# Patient Record
Sex: Female | Born: 2000 | Race: Black or African American | Hispanic: No | Marital: Single | Smoking: Current every day smoker
Health system: Southern US, Community
[De-identification: ages and names within clinical notes are randomized; demographics above are authoritative.]

---
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
## 2021-03-15 ENCOUNTER — Encounter (HOSPITAL_COMMUNITY): Payer: Self-pay

## 2021-03-15 ENCOUNTER — Emergency Department (HOSPITAL_COMMUNITY)
Admission: EM | Admit: 2021-03-15 | Discharge: 2021-03-15 | Disposition: A | Discharge status: Home / Self Care | Payer: Self-pay | Attending: Emergency Medicine | Admitting: Emergency Medicine

## 2021-03-15 ENCOUNTER — Other Ambulatory Visit: Payer: Self-pay

## 2021-03-15 DIAGNOSIS — F17210 Nicotine dependence, cigarettes, uncomplicated: Secondary | ICD-10-CM | POA: Insufficient documentation

## 2021-03-15 DIAGNOSIS — R42 Dizziness and giddiness: Secondary | ICD-10-CM | POA: Insufficient documentation

## 2021-03-15 DIAGNOSIS — R519 Headache, unspecified: Secondary | ICD-10-CM | POA: Insufficient documentation

## 2021-03-15 LAB — COMPREHENSIVE METABOLIC PANEL
ALT: 15 U/L (ref 0–44)
AST: 20 U/L (ref 15–41)
Albumin: 3.8 g/dL (ref 3.5–5.0)
Alkaline Phosphatase: 55 U/L (ref 38–126)
Anion gap: 7 (ref 5–15)
BUN: 6 mg/dL (ref 6–20)
CO2: 24 mmol/L (ref 22–32)
Calcium: 8.8 mg/dL — ABNORMAL LOW (ref 8.9–10.3)
Chloride: 103 mmol/L (ref 98–111)
Creatinine, Ser: 0.64 mg/dL (ref 0.44–1.00)
GFR, Estimated: >60 mL/min (ref >60)
Glucose, Bld: 101 mg/dL — ABNORMAL HIGH (ref 70–99)
Potassium: 3.4 mmol/L — ABNORMAL LOW (ref 3.5–5.1)
Sodium: 134 mmol/L — ABNORMAL LOW (ref 135–145)
Total Bilirubin: 0.3 mg/dL (ref 0.3–1.2)
Total Protein: 7.4 g/dL (ref 6.5–8.1)

## 2021-03-15 LAB — CBC WITH DIFFERENTIAL/PLATELET
Abs Immature Granulocytes: 0.02 10*3/uL (ref 0.00–0.07)
Basophils Absolute: 0 10*3/uL (ref 0.0–0.1)
Basophils Relative: 0 %
Eosinophils Absolute: 0 10*3/uL (ref 0.0–0.5)
Eosinophils Relative: 1 %
HCT: 38.4 % (ref 36.0–46.0)
Hemoglobin: 12.9 g/dL (ref 12.0–15.0)
Immature Granulocytes: 0 %
Lymphocytes Relative: 9 %
Lymphs Abs: 0.5 10*3/uL — ABNORMAL LOW (ref 0.7–4.0)
MCH: 31.3 pg (ref 26.0–34.0)
MCHC: 33.6 g/dL (ref 30.0–36.0)
MCV: 93.2 fL (ref 80.0–100.0)
Monocytes Absolute: 1.4 10*3/uL — ABNORMAL HIGH (ref 0.1–1.0)
Monocytes Relative: 28 %
Neutro Abs: 3.1 10*3/uL (ref 1.7–7.7)
Neutrophils Relative %: 62 %
Platelets: 203 10*3/uL (ref 150–400)
RBC: 4.12 MIL/uL (ref 3.87–5.11)
RDW: 14 % (ref 11.5–15.5)
WBC: 5 10*3/uL (ref 4.0–10.5)
nRBC: 0 % (ref 0.0–0.2)

## 2021-03-15 LAB — I-STAT BETA HCG BLOOD, ED (MC, WL, AP ONLY): I-stat hCG, quantitative: <5 m[IU]/mL (ref <5)

## 2021-03-15 MED ORDER — DIPHENHYDRAMINE HCL 50 MG/ML IJ SOLN
25.0000 mg | Freq: Once | INTRAMUSCULAR | Status: AC
  Administered 2021-03-15: 25 mg via INTRAVENOUS
  Filled 2021-03-15: qty 1

## 2021-03-15 MED ORDER — DEXAMETHASONE SODIUM PHOSPHATE 4 MG/ML IJ SOLN
4.0000 mg | Freq: Once | INTRAMUSCULAR | Status: AC
  Administered 2021-03-15: 4 mg via INTRAVENOUS
  Filled 2021-03-15: qty 1

## 2021-03-15 MED ORDER — MECLIZINE HCL 25 MG PO TABS
25.0000 mg | ORAL_TABLET | Freq: Once | ORAL | Status: AC
  Administered 2021-03-15: 25 mg via ORAL
  Filled 2021-03-15: qty 1

## 2021-03-15 MED ORDER — KETOROLAC TROMETHAMINE 30 MG/ML IJ SOLN
30.0000 mg | Freq: Once | INTRAMUSCULAR | Status: AC
  Administered 2021-03-15: 30 mg via INTRAVENOUS
  Filled 2021-03-15: qty 1

## 2021-03-15 MED ORDER — SODIUM CHLORIDE 0.9 % IV BOLUS
1000.0000 mL | Freq: Once | INTRAVENOUS | Status: AC
  Administered 2021-03-15: 1000 mL via INTRAVENOUS

## 2021-03-15 MED ORDER — METOCLOPRAMIDE HCL 10 MG PO TABS: 10.0000 mg | ORAL_TABLET | Freq: Four times a day (QID) | ORAL | 0 refills | Status: AC | PRN

## 2021-03-15 MED ORDER — ACETAMINOPHEN 500 MG PO TABS
1000.0000 mg | ORAL_TABLET | Freq: Once | ORAL | Status: AC
  Administered 2021-03-15: 1000 mg via ORAL
  Filled 2021-03-15: qty 2

## 2021-03-15 MED ORDER — METOCLOPRAMIDE HCL 5 MG/ML IJ SOLN
10.0000 mg | Freq: Once | INTRAMUSCULAR | Status: AC
  Administered 2021-03-15: 10 mg via INTRAVENOUS
  Filled 2021-03-15: qty 2

## 2021-03-15 NOTE — ED Notes (Signed)
Patient ambulatory to restroom  ?

## 2021-03-15 NOTE — ED Triage Notes (Signed)
Patient c/ointermittent headache x 3 days and intermittent dizziness that occurs when she turns to fast or stands up quickly. Patient denies any N/v or sensitivity to light.

## 2021-03-15 NOTE — ED Notes (Signed)
Pt denies pain and dizziness. Ambulated to restroom with steady gait.

## 2021-03-15 NOTE — ED Provider Notes (Signed)
[facility]-EMERGENCY DEPT Provider Note   CSN: [id number] Arrival date & time: 03/15/21  5427     History Chief Complaint  Patient presents with  . Headache  . Dizziness    Lydia Dunn is a 20 y.o. female here presenting with headache and dizziness.  Patient states that she has been having headaches for the last several years.  She states that she was told she has migraines.  For the last several days she has worsening headaches.  She states that pain is her whole head and is worse when she actually stands up.  She also feels lightheaded and dizzy.  She states that usually she takes ibuprofen and helps with her symptoms but it has not helped this time.  Denies any numbness or weakness or trouble speaking.  Denies any fevers or chills or neck pain.   The history is provided by the patient.       History reviewed. No pertinent past medical history.  There are no problems to display for this patient.   History reviewed. No pertinent surgical history.   OB History   No obstetric history on file.     Family History  Problem Relation Age of Onset  . Chronic Renal Failure Mother     Social History   Tobacco Use  . Smoking status: Current Every Day Smoker    Packs/day: 0.15    Types: Cigarettes  . Smokeless tobacco: Never Used  Vaping Use  . Vaping Use: Never used  Substance Use Topics  . Alcohol use: No  . Drug use: No    Home Medications Prior to Admission medications   Medication Sig Start Date End Date Taking? Authorizing Provider  ibuprofen (ADVIL,MOTRIN) 600 MG tablet Take 1 tablet (600 mg total) by mouth every 6 (six) hours as needed. 06/27/15   Derwood Kaplan, MD  naproxen sodium (ANAPROX) 220 MG tablet Take 220-440 mg by mouth every 12 (twelve) hours as needed (pain).     [provider]    Allergies    Patient has no known allergies.  Review of Systems   Review of Systems  Neurological: Positive for dizziness and  headaches.  All other systems reviewed and are negative.   Physical Exam Updated Vital Signs BP 132/87 (BP Location: Left Arm)   Pulse 84   Temp 99.3 F (37.4 C) (Oral)   Resp 12   Ht 5\' 3"  (1.6 m)   Wt 99.8 kg   LMP 03/02/2021 (Approximate)   SpO2 98%   BMI 38.97 kg/m   Physical Exam Vitals and nursing note reviewed.  Constitutional:      Comments: Uncomfortable   HENT:     Head: Normocephalic.  Eyes:     Extraocular Movements: Extraocular movements intact.     Pupils: Pupils are equal, round, and reactive to light.  Cardiovascular:     Rate and Rhythm: Normal rate and regular rhythm.     Heart sounds: Normal heart sounds.  Pulmonary:     Effort: Pulmonary effort is normal.     Breath sounds: Normal breath sounds.  Abdominal:     General: Bowel sounds are normal.     Palpations: Abdomen is soft.  Musculoskeletal:        General: Normal range of motion.     Cervical back: Normal range of motion and neck supple.  Skin:    General: Skin is warm.  Neurological:     Mental Status: She is alert and oriented to  person, place, and time.     Cranial Nerves: No cranial nerve deficit or facial asymmetry.     Sensory: No sensory deficit.     Motor: No weakness.     Comments: Nl finger to nose, no pronator drift, no facial droop   Psychiatric:        Mood and Affect: Mood normal.        Behavior: Behavior normal.     ED Results / Procedures / Treatments   Labs (all labs ordered are listed, but only abnormal results are displayed) Labs Reviewed  CBC WITH DIFFERENTIAL/PLATELET - Abnormal; Notable for the following components:      Result Value   Lymphs Abs 0.5 (*)    Monocytes Absolute 1.4 (*)    All other components within normal limits  COMPREHENSIVE METABOLIC PANEL - Abnormal; Notable for the following components:   Sodium 134 (*)    Potassium 3.4 (*)    Glucose, Bld 101 (*)    Calcium 8.8 (*)    All other components within normal limits  I-STAT BETA HCG  BLOOD, ED ([facility], [facility], [facility] ONLY)    EKG None  Radiology No results found.  Procedures Procedures   Medications Ordered in ED Medications  sodium chloride 0.9 % bolus 1,000 mL (has no administration in time range)  metoCLOPramide (REGLAN) injection 10 mg (has no administration in time range)  diphenhydrAMINE (BENADRYL) injection 25 mg (has no administration in time range)  ketorolac (TORADOL) 30 MG/ML injection 30 mg (has no administration in time range)  dexamethasone (DECADRON) injection 4 mg (has no administration in time range)  acetaminophen (TYLENOL) tablet 1,000 mg (1,000 mg Oral Given 03/15/21 1816)  meclizine (ANTIVERT) tablet 25 mg (25 mg Oral Given 03/15/21 1817)    ED Course  I have reviewed the triage vital signs and the nursing notes.  Pertinent labs & imaging results that were available during my care of the patient were reviewed by me and considered in my medical decision making (see chart for details).    MDM Rules/Calculators/A&P                         Lydia Dunn is a 20 y.o. female here with headaches and dizziness.  She has history of migraines and has worsening headaches.  She has nonfocal neuro exam.  We will hold off on CT head for now.  We will get basic lab work and hydrate and give migraine cocktail and reassess  8:26 PM Labs unremarkable.  Felt better after migraine cocktail.  Stable for discharge.  We will have her follow-up with neurology outpatient   Final Clinical Impression(s) / ED Diagnoses Final diagnoses:  None    Rx / DC Orders ED Discharge Orders    None       Charlynne Pander, MD 03/15/21 2028

## 2021-03-15 NOTE — ED Provider Notes (Signed)
Emergency Medicine Provider Triage Evaluation Note  Lydia Dunn , a 20 y.o. female  was evaluated in triage.  Pt complains of headache, nasal congestion, dizziness.  Patient states her symptoms began few days ago.  She reports she was having intermittent symptoms until last night, she has had persistent headache since which she describes as a tightness.  She does report continued intermittent dizziness, worse when she stands up or moves her head or eyes in different direction.  She describes her dizziness as feeling lightheaded or like she is in a fall over.  She does have a history of migraines, but has never had anything like this.  She took medicine for headache last night without significant improvement symptoms, has not tried anything else.  She denies fall, trauma, or injury.  No fevers or chills.  No vision changes.  No numbness or weakness.  She has a history of allergies, does not take medicine for it.  Review of Systems  Positive: HA, dizziness, nasal congestion Negative: fever  Physical Exam  BP (!) 138/92 (BP Location: Left Arm)   Pulse 86   Temp 98.8 F (37.1 C) (Oral)   Resp 17   SpO2 99%  Gen:   Awake, no distress   Resp:  Normal effort  MSK:   Moves extremities without difficulty  Other:  CN intact.  Nose to finger intact.  Equal grip strength.  Nasal congestion noted bilaterally.  TMs nonerythematous.  Medical Decision Making  Medically screening exam initiated at 5:07 PM.  Appropriate orders placed.  Lydia Dunn was informed that the remainder of the evaluation will be completed by another provider, this initial triage assessment does not replace that evaluation, and the importance of remaining in the ED until their evaluation is complete.  Labs and meds ordered.    Lydia Apley, PA-C 03/15/21 1721    Lydia Sprout, MD 03/15/21 2234

## 2021-03-15 NOTE — Discharge Instructions (Signed)
Take Tylenol or Motrin for headache.  Take Reglan for severe headache.  See neurology for follow-up  Return to ER if you have worse headache, dizziness, passing out.
